# Patient Record
Sex: Male | Born: 1985
Health system: Southern US, Community
[De-identification: ages and names within clinical notes are randomized; demographics above are authoritative.]

## PROBLEM LIST (undated history)

## (undated) DIAGNOSIS — S2239XA Fracture of one rib, unspecified side, initial encounter for closed fracture: Secondary | ICD-10-CM

---

## 2018-01-07 ENCOUNTER — Encounter (HOSPITAL_COMMUNITY): Payer: Self-pay | Admitting: Family Medicine

## 2018-01-07 ENCOUNTER — Ambulatory Visit (HOSPITAL_COMMUNITY)
Admission: EM | Admit: 2018-01-07 | Discharge: 2018-01-07 | Disposition: A | Discharge status: Home / Self Care | Payer: Commercial Managed Care - PPO | Attending: Family Medicine | Admitting: Family Medicine

## 2018-01-07 DIAGNOSIS — J40 Bronchitis, not specified as acute or chronic: Secondary | ICD-10-CM

## 2018-01-07 MED ORDER — PREDNISONE 50 MG PO TABS: 50.0000 mg | ORAL_TABLET | Freq: Every day | ORAL | 0 refills | Status: AC

## 2018-01-07 MED ORDER — AZITHROMYCIN 250 MG PO TABS: 250.0000 mg | ORAL_TABLET | Freq: Every day | ORAL | 0 refills | Status: DC

## 2018-01-07 MED ORDER — BENZONATATE 100 MG PO CAPS: 100.0000 mg | ORAL_CAPSULE | Freq: Three times a day (TID) | ORAL | 0 refills | Status: AC

## 2018-01-07 NOTE — ED Triage Notes (Signed)
Pt here for cough x 1 week and half. Some blood trace and mucous. sts slight fever in the beginning.

## 2018-01-07 NOTE — Discharge Instructions (Signed)
Azithromycin and prednisone for bronchitis. Tessalon for cough. Keep hydrated, your urine should be clear to pale yellow in color. Tylenol/motrin for fever and pain. Monitor for any worsening of symptoms, chest pain, shortness of breath, wheezing, swelling of the throat, follow up for reevaluation.   For sore throat/cough try using a honey-based tea. Use 3 teaspoons of honey with juice squeezed from half lemon. Place shaved pieces of ginger into 1/2-1 cup of water and warm over stove top. Then mix the ingredients and repeat every 4 hours as needed.

## 2018-01-07 NOTE — ED Provider Notes (Signed)
[facility]    CSN: [id number] Arrival date & time: 01/07/18  1001     History   Chief Complaint Chief Complaint  Patient presents with  . Cough    HPI Megan SalonBrian J Stumpe is a 32 y.o. male.   32 year old male comes in for 1.5-week history of URI symptoms.  States started out with productive cough, rhinorrhea, nasal congestion, subjective fever.  Other symptoms have since resolved, but has continued with productive cough.  States for the past 2 days, had 2 episodes of blood-tinged sputum, and came in for evaluation.  Denies chest pain, shortness of breath, wheezing.  Denies chills, night sweats.  OTC DayQuil, Robitussin with some relief.  Current every day smoker, 11 years, 0.25 pack/day.     History reviewed. No pertinent past medical history.  There are no active problems to display for this patient.   History reviewed. No pertinent surgical history.     Home Medications    Prior to Admission medications   Medication Sig Start Date End Date Taking? Authorizing Provider  azithromycin (ZITHROMAX) 250 MG tablet Take 1 tablet (250 mg total) by mouth daily. Take first 2 tablets together, then 1 every day until finished. 01/07/18   Cathie HoopsYu, Shawndale Kilpatrick V, PA-C  benzonatate (TESSALON) 100 MG capsule Take 1 capsule (100 mg total) by mouth every 8 (eight) hours. 01/07/18   Cathie HoopsYu, Kage Willmann V, PA-C  predniSONE (DELTASONE) 50 MG tablet Take 1 tablet (50 mg total) by mouth daily for 4 days. 01/07/18 01/11/18  Belinda FisherYu, Takahiro Godinho V, PA-C    Family History History reviewed. No pertinent family history.  Social History Social History   Tobacco Use  . Smoking status: Current Every Day Smoker  . Smokeless tobacco: Never Used  Substance Use Topics  . Alcohol use: Not on file  . Drug use: Not on file     Allergies   Patient has no known allergies.   Review of Systems Review of Systems  Reason unable to perform ROS: See HPI as above.     Physical Exam Triage Vital Signs ED Triage Vitals  Enc  Vitals Group     BP 01/07/18 1025 123/90     Pulse Rate 01/07/18 1025 73     Resp 01/07/18 1025 18     Temp 01/07/18 1025 98.6 F (37 C)     Temp src --      SpO2 01/07/18 1025 98 %     Weight --      Height --      Head Circumference --      Peak Flow --      Pain Score 01/07/18 1023 0     Pain Loc --      Pain Edu? --      Excl. in GC? --    No data found.  Updated Vital Signs BP 123/90   Pulse 73   Temp 98.6 F (37 C)   Resp 18   SpO2 98%   Physical Exam  Constitutional: He is oriented to person, place, and time. He appears well-developed and well-nourished. No distress.  HENT:  Head: Normocephalic and atraumatic.  Right Ear: Tympanic membrane, external ear and ear canal normal. Tympanic membrane is not erythematous and not bulging.  Left Ear: Tympanic membrane, external ear and ear canal normal. Tympanic membrane is not erythematous and not bulging.  Nose: Nose normal. Right sinus exhibits no maxillary sinus tenderness and no frontal sinus tenderness. Left sinus exhibits no maxillary sinus tenderness  and no frontal sinus tenderness.  Mouth/Throat: Uvula is midline, oropharynx is clear and moist and mucous membranes are normal.  Eyes: Pupils are equal, round, and reactive to light. Conjunctivae are normal.  Neck: Normal range of motion. Neck supple.  Cardiovascular: Normal rate, regular rhythm and normal heart sounds. Exam reveals no gallop and no friction rub.  No murmur heard. Pulmonary/Chest: Effort normal and breath sounds normal. He has no decreased breath sounds. He has no wheezes. He has no rhonchi. He has no rales.  Lymphadenopathy:    He has no cervical adenopathy.  Neurological: He is alert and oriented to person, place, and time.  Skin: Skin is warm and dry.  Psychiatric: He has a normal mood and affect. His behavior is normal. Judgment normal.     UC Treatments / Results  Labs (all labs ordered are listed, but only abnormal results are displayed) Labs  Reviewed - No data to display  EKG None Radiology No results found.  Procedures Procedures (including critical care time)  Medications Ordered in UC Medications - No data to display   Initial Impression / Assessment and Plan / UC Course  I have reviewed the triage vital signs and the nursing notes.  Pertinent labs & imaging results that were available during my care of the patient were reviewed by me and considered in my medical decision making (see chart for details).    Patient with 1.5 week history of productive cough, with 2 episodes of blood tinged sputum. Patient is afebrile without tachycardia, tachypnea, sitting comfortably without any distress. Discussed treating for bronchitis vs CXR. Patient would like to defer CXR for now. Azithromycin and prednisone as directed. Other symptomatic treatment as discussed. Return precautions given. Patient expresses understanding and agrees to plan.   Final Clinical Impressions(s) / UC Diagnoses   Final diagnoses:  Bronchitis    ED Discharge Orders        Ordered    azithromycin (ZITHROMAX) 250 MG tablet  Daily     01/07/18 1037    predniSONE (DELTASONE) 50 MG tablet  Daily     01/07/18 1037    benzonatate (TESSALON) 100 MG capsule  Every 8 hours     01/07/18 1037        Belinda Fisher, PA-C 01/07/18 1042

## 2018-10-01 ENCOUNTER — Emergency Department (HOSPITAL_COMMUNITY): Payer: Commercial Managed Care - PPO

## 2018-10-01 ENCOUNTER — Other Ambulatory Visit: Payer: Self-pay

## 2018-10-01 ENCOUNTER — Emergency Department (HOSPITAL_COMMUNITY)
Admission: EM | Admit: 2018-10-01 | Discharge: 2018-10-01 | Disposition: A | Discharge status: Home / Self Care | Payer: Commercial Managed Care - PPO | Attending: Emergency Medicine | Admitting: Emergency Medicine

## 2018-10-01 DIAGNOSIS — R51 Headache: Secondary | ICD-10-CM | POA: Insufficient documentation

## 2018-10-01 DIAGNOSIS — Y999 Unspecified external cause status: Secondary | ICD-10-CM | POA: Insufficient documentation

## 2018-10-01 DIAGNOSIS — S2242XA Multiple fractures of ribs, left side, initial encounter for closed fracture: Secondary | ICD-10-CM

## 2018-10-01 DIAGNOSIS — R102 Pelvic and perineal pain: Secondary | ICD-10-CM | POA: Diagnosis not present

## 2018-10-01 DIAGNOSIS — S299XXA Unspecified injury of thorax, initial encounter: Secondary | ICD-10-CM | POA: Diagnosis present

## 2018-10-01 DIAGNOSIS — F17200 Nicotine dependence, unspecified, uncomplicated: Secondary | ICD-10-CM | POA: Diagnosis not present

## 2018-10-01 DIAGNOSIS — Y939 Activity, unspecified: Secondary | ICD-10-CM | POA: Diagnosis not present

## 2018-10-01 DIAGNOSIS — Y92410 Unspecified street and highway as the place of occurrence of the external cause: Secondary | ICD-10-CM | POA: Diagnosis not present

## 2018-10-01 DIAGNOSIS — S27321A Contusion of lung, unilateral, initial encounter: Secondary | ICD-10-CM | POA: Diagnosis not present

## 2018-10-01 LAB — COMPREHENSIVE METABOLIC PANEL
ALBUMIN: 3.7 g/dL (ref 3.5–5.0)
ALK PHOS: 42 U/L (ref 38–126)
ALT: 44 U/L (ref 0–44)
AST: 43 U/L — AB (ref 15–41)
Anion gap: 10 (ref 5–15)
BILIRUBIN TOTAL: 1 mg/dL (ref 0.3–1.2)
BUN: 10 mg/dL (ref 6–20)
CO2: 20 mmol/L — ABNORMAL LOW (ref 22–32)
CREATININE: 1.23 mg/dL (ref 0.61–1.24)
Calcium: 8.7 mg/dL — ABNORMAL LOW (ref 8.9–10.3)
Chloride: 108 mmol/L (ref 98–111)
GFR calc Af Amer: >60 mL/min (ref >60)
GLUCOSE: 167 mg/dL — AB (ref 70–99)
POTASSIUM: 4.2 mmol/L (ref 3.5–5.1)
Sodium: 138 mmol/L (ref 135–145)
TOTAL PROTEIN: 6.6 g/dL (ref 6.5–8.1)

## 2018-10-01 LAB — CBC
HEMATOCRIT: 51.2 % (ref 39.0–52.0)
HEMOGLOBIN: 17.7 g/dL — AB (ref 13.0–17.0)
MCH: 29.5 pg (ref 26.0–34.0)
MCHC: 34.6 g/dL (ref 30.0–36.0)
MCV: 85.3 fL (ref 80.0–100.0)
Platelets: 288 10*3/uL (ref 150–400)
RBC: 6 MIL/uL — AB (ref 4.22–5.81)
RDW: 12.6 % (ref 11.5–15.5)
WBC: 13.7 10*3/uL — ABNORMAL HIGH (ref 4.0–10.5)
nRBC: 0 % (ref 0.0–0.2)

## 2018-10-01 LAB — I-STAT CHEM 8, ED
BUN: 11 mg/dL (ref 6–20)
CALCIUM ION: 1.06 mmol/L — AB (ref 1.15–1.40)
CHLORIDE: 108 mmol/L (ref 98–111)
Creatinine, Ser: 1.1 mg/dL (ref 0.61–1.24)
GLUCOSE: 173 mg/dL — AB (ref 70–99)
HCT: 50 % (ref 39.0–52.0)
Hemoglobin: 17 g/dL (ref 13.0–17.0)
Potassium: 4.1 mmol/L (ref 3.5–5.1)
Sodium: 138 mmol/L (ref 135–145)
TCO2: 23 mmol/L (ref 22–32)

## 2018-10-01 LAB — URINALYSIS, ROUTINE W REFLEX MICROSCOPIC
BACTERIA UA: NONE SEEN
Bilirubin Urine: NEGATIVE
Glucose, UA: NEGATIVE mg/dL
Ketones, ur: NEGATIVE mg/dL
Leukocytes, UA: NEGATIVE
Nitrite: NEGATIVE
PH: 5 (ref 5.0–8.0)
Protein, ur: NEGATIVE mg/dL
SPECIFIC GRAVITY, URINE: 1.036 — AB (ref 1.005–1.030)

## 2018-10-01 MED ORDER — METHOCARBAMOL 500 MG PO TABS: 500.0000 mg | ORAL_TABLET | Freq: Three times a day (TID) | ORAL | 0 refills | Status: AC | PRN

## 2018-10-01 MED ORDER — MORPHINE SULFATE (PF) 4 MG/ML IV SOLN
6.0000 mg | Freq: Once | INTRAVENOUS | Status: AC
  Administered 2018-10-01: 6 mg via INTRAVENOUS
  Filled 2018-10-01: qty 2

## 2018-10-01 MED ORDER — HYDROCODONE-ACETAMINOPHEN 5-325 MG PO TABS: 1.0000 | ORAL_TABLET | ORAL | 0 refills | Status: AC | PRN

## 2018-10-01 MED ORDER — IOHEXOL 300 MG/ML  SOLN
100.0000 mL | Freq: Once | INTRAMUSCULAR | Status: AC | PRN
  Administered 2018-10-01: 100 mL via INTRAVENOUS

## 2018-10-01 MED ORDER — SODIUM CHLORIDE 0.9 % IV BOLUS
1000.0000 mL | Freq: Once | INTRAVENOUS | Status: AC
  Administered 2018-10-01: 1000 mL via INTRAVENOUS

## 2018-10-01 NOTE — ED Notes (Signed)
Pt ambulated in hall with this RN. Pt denied any dizziness or shortness of breath with ambulation. Pt just complains of generalized stiffness and soreness.
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
## 2018-10-01 NOTE — ED Notes (Signed)
Discharge instructions given to patient. Pt verbalized understanding. Medications reviewed with patient.

## 2018-10-05 ENCOUNTER — Ambulatory Visit (HOSPITAL_COMMUNITY)
Admission: EM | Admit: 2018-10-05 | Discharge: 2018-10-05 | Disposition: A | Discharge status: Home / Self Care | Payer: Commercial Managed Care - PPO | Attending: Internal Medicine | Admitting: Internal Medicine

## 2018-10-05 ENCOUNTER — Encounter (HOSPITAL_COMMUNITY): Payer: Self-pay | Admitting: *Deleted

## 2018-10-05 DIAGNOSIS — H538 Other visual disturbances: Secondary | ICD-10-CM | POA: Insufficient documentation

## 2018-10-05 HISTORY — DX: Fracture of one rib, unspecified side, initial encounter for closed fracture: S22.39XA

## 2018-10-05 MED ORDER — PREDNISONE 50 MG PO TABS: 50.0000 mg | ORAL_TABLET | Freq: Every day | ORAL | 0 refills | Status: AC

## 2018-10-05 MED ORDER — NAPROXEN 500 MG PO TABS: 500.0000 mg | ORAL_TABLET | Freq: Two times a day (BID) | ORAL | 0 refills | Status: AC
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
## 2018-10-05 NOTE — Discharge Instructions (Addendum)
Please follow-up with Dr. Alden Hipp tomorrow at 8 AM for your eye  Please begin taking prednisone daily with food for the next 5 days to see if this helps anymore with pain, swelling and tingling  May continue Aleve/Naprosyn after finishing prednisone  Please follow-up if developing weakness in arm, tingling continuing to persist
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
## 2022-02-05 ENCOUNTER — Ambulatory Visit
Admission: EM | Admit: 2022-02-05 | Discharge: 2022-02-05 | Disposition: A | Discharge status: Home / Self Care | Payer: Commercial Managed Care - PPO

## 2022-02-05 ENCOUNTER — Ambulatory Visit (HOSPITAL_COMMUNITY): Payer: Self-pay

## 2022-02-05 DIAGNOSIS — W57XXXA Bitten or stung by nonvenomous insect and other nonvenomous arthropods, initial encounter: Secondary | ICD-10-CM | POA: Diagnosis not present

## 2022-02-05 DIAGNOSIS — L989 Disorder of the skin and subcutaneous tissue, unspecified: Secondary | ICD-10-CM

## 2022-02-05 MED ORDER — TRIAMCINOLONE ACETONIDE 0.1 % EX CREA: 1.0000 "application " | TOPICAL_CREAM | Freq: Two times a day (BID) | CUTANEOUS | 0 refills | Status: AC

## 2022-02-05 NOTE — ED Triage Notes (Signed)
Pt presents with c/o tick located on his back that he is unable to remove himself. Pt states he noticed the tick on Saturday.  ?

## 2022-02-05 NOTE — ED Provider Notes (Signed)
?[facility] ? ? ? ?CSN: [id number] ?Arrival date & time: 02/05/22  0809 ? ? ?  ? ?History   ?Chief Complaint ?Chief Complaint  ?Patient presents with  ? Tick Removal  ? ? ?HPI ?Kenneth Vasquez is a 36 y.o. male.  ? ?Patient presenting today with a tick on his back that he noticed on Saturday.  He states he did not have anybody available to take the tick off of him and he could not reach himself.  He denies fever, chills, drainage from the site, headaches, rashes.  Has not tried anything over-the-counter for symptoms. ? ? ?Past Medical History:  ?Diagnosis Date  ? Fractured rib   ? ? ?There are no problems to display for this patient. ? ? ?History reviewed. No pertinent surgical history. ? ? ? ? ?Home Medications   ? ?Prior to Admission medications   ?Medication Sig Start Date End Date Taking? Authorizing Provider  ?rosuvastatin (CRESTOR) 10 MG tablet Take 10 mg by mouth daily.   Yes [provider]  ?triamcinolone cream (KENALOG) 0.1 % Apply 1 application. topically 2 (two) times daily. 02/05/22  Yes Volney American, PA-C  ?benzonatate (TESSALON) 100 MG capsule Take 1 capsule (100 mg total) by mouth every 8 (eight) hours. ?Patient not taking: Reported on 10/01/2018 01/07/18   Ok Edwards, PA-C  ?HYDROcodone-acetaminophen (NORCO/VICODIN) 5-325 MG tablet Take 1 tablet by mouth every 4 (four) hours as needed for moderate pain. ?Patient not taking: Reported on 02/05/2022 10/01/18   Jola Schmidt, MD  ?methocarbamol (ROBAXIN) 500 MG tablet Take 1 tablet (500 mg total) by mouth every 8 (eight) hours as needed for muscle spasms. ?Patient not taking: Reported on 02/05/2022 10/01/18   Jola Schmidt, MD  ?naproxen (NAPROSYN) 500 MG tablet Take 1 tablet (500 mg total) by mouth 2 (two) times daily. ?Patient not taking: Reported on 02/05/2022 10/05/18   Janith Lima, PA-C  ? ? ?Family History ?Family History  ?Problem Relation Age of Onset  ? Hypertension Mother   ? Stroke Mother   ? Heart disease Mother   ?  Hypertension Father   ? Heart disease Father   ? ? ?Social History ?Social History  ? ?Tobacco Use  ? Smoking status: Former  ? Smokeless tobacco: Never  ? Tobacco comments:  ?  quit 08/2018  ?Vaping Use  ? Vaping Use: Never used  ?Substance Use Topics  ? Alcohol use: Not Currently  ? Drug use: Yes  ?  Types: Marijuana  ? ? ? ?Allergies   ?Patient has no known allergies. ? ? ?Review of Systems ?Review of Systems ?Per HPI ? ?Physical Exam ?Triage Vital Signs ?ED Triage Vitals  ?Enc Vitals Group  ?   BP 02/05/22 0824 133/87  ?   Pulse Rate 02/05/22 0824 68  ?   Resp 02/05/22 0824 14  ?   Temp 02/05/22 0824 98.1 ?F (36.7 ?C)  ?   Temp Source 02/05/22 0824 Oral  ?   SpO2 02/05/22 0824 97 %  ?   Weight --   ?   Height --   ?   Head Circumference --   ?   Peak Flow --   ?   Pain Score 02/05/22 0825 0  ?   Pain Loc --   ?   Pain Edu? --   ?   Excl. in [place]? --   ? ?No data found. ? ?Updated Vital Signs ?BP 133/87 (BP Location: Right Arm)   Pulse  68   Temp 98.1 ?F (36.7 ?C) (Oral)   Resp 14   SpO2 97%  ? ?Visual Acuity ?Right Eye Distance:   ?Left Eye Distance:   ?Bilateral Distance:   ? ?Right Eye Near:   ?Left Eye Near:    ?Bilateral Near:    ? ?Physical Exam ?Vitals and nursing note reviewed.  ?Constitutional:   ?   Appearance: Normal appearance.  ?HENT:  ?   Head: Atraumatic.  ?Eyes:  ?   Extraocular Movements: Extraocular movements intact.  ?   Conjunctiva/sclera: Conjunctivae normal.  ?Cardiovascular:  ?   Rate and Rhythm: Normal rate and regular rhythm.  ?Pulmonary:  ?   Effort: Pulmonary effort is normal.  ?   Breath sounds: Normal breath sounds.  ?Musculoskeletal:     ?   General: Normal range of motion.  ?   Cervical back: Normal range of motion and neck supple.  ?Skin: ?   General: Skin is warm.  ?   Comments: Erythematous edematous nodular lesion on center of back with active tick attached  ?Neurological:  ?   General: No focal deficit present.  ?   Mental Status: He is oriented to person, place, and time.   ?Psychiatric:     ?   Mood and Affect: Mood normal.     ?   Thought Content: Thought content normal.     ?   Judgment: Judgment normal.  ? ?UC Treatments / Results  ?Labs ?(all labs ordered are listed, but only abnormal results are displayed) ?Labs Reviewed - No data to display ? ?EKG ? ?Radiology ?No results found. ? ?Procedures ?Procedures (including critical care time) ? ?Medications Ordered in UC ?Medications - No data to display ? ?Initial Impression / Assessment and Plan / UC Course  ?I have reviewed the triage vital signs and the nursing notes. ? ?Pertinent labs & imaging results that were available during my care of the patient were reviewed by me and considered in my medical decision making (see chart for details). ? ?  ? ?Tick removed with forceps, cleaned with alcohol.  Treat with triamcinolone cream, Neosporin, keep covered.  Discussed signs of tick illness and to watch for these over the next 2 weeks.  Return for any worsening symptoms. ? ?Final Clinical Impressions(s) / UC Diagnoses  ? ?Final diagnoses:  ?Skin lesion  ?Tick bite, unspecified site, initial encounter  ? ?Discharge Instructions   ?None ?  ? ?ED Prescriptions   ? ? Medication Sig Dispense Auth. Provider  ? triamcinolone cream (KENALOG) 0.1 % Apply 1 application. topically 2 (two) times daily. 60 g Volney American, [place]  ? ?  ? ?PDMP not reviewed this encounter. ?  ?Volney American, PA-C ?02/05/22 [id number] ? ?

## 2022-06-18 NOTE — Progress Notes (Deleted)
 @  Patient ID: Kenneth Vasquez, male    DOB: 06/12/1986, 36 y.o.   MRN: [id number]  No chief complaint on file.   Referring provider: Mittie Bodo  HPI: 36 year old male, former smoker.  No significant past medical history.  06/19/2022 Patient presents today for sleep consult..        No Known Allergies   There is no immunization history on file for this patient.  Past Medical History:  Diagnosis Date   Fractured rib     Tobacco History: Social History   Tobacco Use  Smoking Status Former  Smokeless Tobacco Never  Tobacco Comments   quit 08/2018   Counseling given: Not Answered Tobacco comments: quit 08/2018   Outpatient Medications Prior to Visit  Medication Sig Dispense Refill   benzonatate (TESSALON) 100 MG capsule Take 1 capsule (100 mg total) by mouth every 8 (eight) hours. (Patient not taking: Reported on 10/01/2018) 21 capsule 0   HYDROcodone-acetaminophen (NORCO/VICODIN) 5-325 MG tablet Take 1 tablet by mouth every 4 (four) hours as needed for moderate pain. (Patient not taking: Reported on 02/05/2022) 15 tablet 0   methocarbamol (ROBAXIN) 500 MG tablet Take 1 tablet (500 mg total) by mouth every 8 (eight) hours as needed for muscle spasms. (Patient not taking: Reported on 02/05/2022) 12 tablet 0   naproxen (NAPROSYN) 500 MG tablet Take 1 tablet (500 mg total) by mouth 2 (two) times daily. (Patient not taking: Reported on 02/05/2022) 30 tablet 0   rosuvastatin (CRESTOR) 10 MG tablet Take 10 mg by mouth daily.     triamcinolone cream (KENALOG) 0.1 % Apply 1 application. topically 2 (two) times daily. 60 g 0   No facility-administered medications prior to visit.      Review of Systems  Review of Systems   Physical Exam  There were no vitals taken for this visit. Physical Exam   Lab Results:  CBC    Component Value Date/Time   WBC 13.7 (H) 10/01/2018 0854   RBC 6.00 (H) 10/01/2018 0854   HGB 17.0 10/01/2018 0915   HCT 50.0 10/01/2018  0915   PLT 288 10/01/2018 0854   MCV 85.3 10/01/2018 0854   MCH 29.5 10/01/2018 0854   MCHC 34.6 10/01/2018 0854   RDW 12.6 10/01/2018 0854    BMET    Component Value Date/Time   NA 138 10/01/2018 0915   K 4.1 10/01/2018 0915   CL 108 10/01/2018 0915   CO2 20 (L) 10/01/2018 0854   GLUCOSE 173 (H) 10/01/2018 0915   BUN 11 10/01/2018 0915   CREATININE 1.10 10/01/2018 0915   CALCIUM 8.7 (L) 10/01/2018 0854   GFRNONAA >60 10/01/2018 0854   GFRAA >60 10/01/2018 0854    BNP No results found for: "BNP"  ProBNP No results found for: "PROBNP"  Imaging: No results found.   Assessment & Plan:   No problem-specific Assessment & Plan notes found for this encounter.     Martyn Ehrich, NP 06/18/2022

## 2022-06-19 ENCOUNTER — Institutional Professional Consult (permissible substitution): Payer: Commercial Managed Care - PPO | Admitting: Primary Care
